# Patient Record
Sex: Male | Born: 1955 | Race: Asian | Hispanic: No | Marital: Single
Health system: Southern US, Community
[De-identification: ages and names within clinical notes are randomized; demographics above are authoritative.]

---

## 2020-06-28 ENCOUNTER — Emergency Department: Payer: Self-pay

## 2020-06-28 ENCOUNTER — Emergency Department
Admission: EM | Admit: 2020-06-28 | Discharge: 2020-07-23 | Disposition: E | Payer: Self-pay | Attending: Emergency Medicine | Admitting: Emergency Medicine

## 2020-06-28 ENCOUNTER — Encounter: Admission: EM | Disposition: E | Payer: Self-pay | Source: Home / Self Care

## 2020-06-28 ENCOUNTER — Other Ambulatory Visit: Payer: Self-pay

## 2020-06-28 DIAGNOSIS — I469 Cardiac arrest, cause unspecified: Secondary | ICD-10-CM | POA: Insufficient documentation

## 2020-06-28 DIAGNOSIS — Z978 Presence of other specified devices: Secondary | ICD-10-CM

## 2020-06-28 HISTORY — PX: CORONARY/GRAFT ACUTE MI REVASCULARIZATION: CATH118305

## 2020-06-28 HISTORY — PX: LEFT HEART CATH AND CORONARY ANGIOGRAPHY: CATH118249

## 2020-06-28 LAB — CBC WITH DIFFERENTIAL/PLATELET
Abs Immature Granulocytes: 2.13 10*3/uL — ABNORMAL HIGH (ref 0.00–0.07)
Basophils Absolute: 0.1 10*3/uL (ref 0.0–0.1)
Basophils Relative: 1 %
Eosinophils Absolute: 0.9 10*3/uL — ABNORMAL HIGH (ref 0.0–0.5)
Eosinophils Relative: 4 %
HCT: 38.9 % — ABNORMAL LOW (ref 39.0–52.0)
Hemoglobin: 12.3 g/dL — ABNORMAL LOW (ref 13.0–17.0)
Immature Granulocytes: 10 %
Lymphocytes Relative: 37 %
Lymphs Abs: 7.6 10*3/uL — ABNORMAL HIGH (ref 0.7–4.0)
MCH: 25.1 pg — ABNORMAL LOW (ref 26.0–34.0)
MCHC: 31.6 g/dL (ref 30.0–36.0)
MCV: 79.2 fL — ABNORMAL LOW (ref 80.0–100.0)
Monocytes Absolute: 0.6 10*3/uL (ref 0.1–1.0)
Monocytes Relative: 3 %
Neutro Abs: 9.4 10*3/uL — ABNORMAL HIGH (ref 1.7–7.7)
Neutrophils Relative %: 45 %
Platelets: 113 10*3/uL — ABNORMAL LOW (ref 150–400)
RBC: 4.91 MIL/uL (ref 4.22–5.81)
RDW: 15 % (ref 11.5–15.5)
WBC: 20.6 10*3/uL — ABNORMAL HIGH (ref 4.0–10.5)
nRBC: 0.2 % (ref 0.0–0.2)

## 2020-06-28 LAB — COMPREHENSIVE METABOLIC PANEL
ALT: 148 U/L — ABNORMAL HIGH (ref 0–44)
AST: 185 U/L — ABNORMAL HIGH (ref 15–41)
Albumin: 2.9 g/dL — ABNORMAL LOW (ref 3.5–5.0)
Alkaline Phosphatase: 75 U/L (ref 38–126)
Anion gap: 19 — ABNORMAL HIGH (ref 5–15)
BUN: 23 mg/dL (ref 8–23)
CO2: 20 mmol/L — ABNORMAL LOW (ref 22–32)
Calcium: 11.5 mg/dL — ABNORMAL HIGH (ref 8.9–10.3)
Chloride: 101 mmol/L (ref 98–111)
Creatinine, Ser: 1.63 mg/dL — ABNORMAL HIGH (ref 0.61–1.24)
GFR calc Af Amer: 52 mL/min — ABNORMAL LOW (ref >60)
GFR calc non Af Amer: 44 mL/min — ABNORMAL LOW (ref >60)
Glucose, Bld: 319 mg/dL — ABNORMAL HIGH (ref 70–99)
Potassium: 4.1 mmol/L (ref 3.5–5.1)
Sodium: 140 mmol/L (ref 135–145)
Total Bilirubin: 1.2 mg/dL (ref 0.3–1.2)
Total Protein: 5.1 g/dL — ABNORMAL LOW (ref 6.5–8.1)

## 2020-06-28 LAB — GLUCOSE, CAPILLARY: Glucose-Capillary: 255 mg/dL — ABNORMAL HIGH (ref 70–99)

## 2020-06-28 LAB — TROPONIN I (HIGH SENSITIVITY): Troponin I (High Sensitivity): 611 ng/L (ref <18)

## 2020-06-28 SURGERY — CORONARY/GRAFT ACUTE MI REVASCULARIZATION
Anesthesia: Moderate Sedation

## 2020-06-28 MED ORDER — ETOMIDATE 2 MG/ML IV SOLN
INTRAVENOUS | Status: AC | PRN
  Administered 2020-06-28: 30 mg via INTRAVENOUS

## 2020-06-28 MED ORDER — BIVALIRUDIN TRIFLUOROACETATE 250 MG IV SOLR
INTRAVENOUS | Status: AC
  Filled 2020-06-28: qty 250

## 2020-06-28 MED ORDER — MAGNESIUM SULFATE 50 % IJ SOLN
INTRAMUSCULAR | Status: AC | PRN
  Administered 2020-06-28: 2 g via INTRAVENOUS

## 2020-06-28 MED ORDER — AMIODARONE HCL 150 MG/3ML IV SOLN
INTRAVENOUS | Status: DC | PRN
  Administered 2020-06-28 (×3): 300 mg via INTRAVENOUS

## 2020-06-28 MED ORDER — LIDOCAINE HCL (PF) 1 % IJ SOLN
INTRAMUSCULAR | Status: DC | PRN
  Administered 2020-06-28: 20 mL

## 2020-06-28 MED ORDER — LIDOCAINE HCL (CARDIAC) PF 100 MG/5ML IV SOSY
PREFILLED_SYRINGE | INTRAVENOUS | Status: AC | PRN
  Administered 2020-06-28: 100 mg via INTRAVENOUS

## 2020-06-28 MED ORDER — MAGNESIUM SULFATE 50 % IJ SOLN
INTRAMUSCULAR | Status: DC | PRN
  Administered 2020-06-28: 2 g via INTRAVENOUS

## 2020-06-28 MED ORDER — AMIODARONE HCL 150 MG/3ML IV SOLN
INTRAVENOUS | Status: AC | PRN
  Administered 2020-06-28: 300 mg via INTRAVENOUS

## 2020-06-28 MED ORDER — FENTANYL CITRATE (PF) 100 MCG/2ML IJ SOLN
INTRAMUSCULAR | Status: AC
  Filled 2020-06-28: qty 2

## 2020-06-28 MED ORDER — AMIODARONE IV BOLUS ONLY 150 MG/100ML
INTRAVENOUS | Status: AC
  Filled 2020-06-28: qty 100

## 2020-06-28 MED ORDER — NOREPINEPHRINE 4 MG/250ML-% IV SOLN
INTRAVENOUS | Status: AC | PRN
  Administered 2020-06-28: 15 ug/min via INTRAVENOUS

## 2020-06-28 MED ORDER — CALCIUM CHLORIDE 10 % IV SOLN
INTRAVENOUS | Status: AC | PRN
  Administered 2020-06-28: 1 g via INTRAVENOUS

## 2020-06-28 MED ORDER — ROCURONIUM BROMIDE 50 MG/5ML IV SOLN
INTRAVENOUS | Status: AC | PRN
  Administered 2020-06-28: 30 mg via INTRAVENOUS

## 2020-06-28 MED ORDER — SODIUM BICARBONATE 8.4 % IV SOLN
INTRAVENOUS | Status: AC | PRN
  Administered 2020-06-28: 50 meq via INTRAVENOUS

## 2020-06-28 MED ORDER — EPINEPHRINE 1 MG/10ML IJ SOSY
PREFILLED_SYRINGE | INTRAMUSCULAR | Status: DC | PRN
  Administered 2020-06-28 (×4): 1 mg via INTRAVENOUS

## 2020-06-28 MED ORDER — SODIUM BICARBONATE 8.4 % IV SOLN
INTRAVENOUS | Status: AC
  Filled 2020-06-28: qty 100

## 2020-06-28 MED ORDER — METOPROLOL TARTRATE 5 MG/5ML IV SOLN
INTRAVENOUS | Status: AC
  Filled 2020-06-28: qty 5

## 2020-06-28 MED ORDER — SODIUM CHLORIDE 0.9 % IV BOLUS
1000.0000 mL | Freq: Once | INTRAVENOUS | Status: AC
  Administered 2020-06-28: 1000 mL via INTRAVENOUS

## 2020-06-28 MED ORDER — AMIODARONE HCL 150 MG/3ML IV SOLN
INTRAVENOUS | Status: AC | PRN
  Administered 2020-06-28 (×2): 150 mg via INTRAVENOUS

## 2020-06-28 MED ORDER — EPINEPHRINE 1 MG/10ML IJ SOSY
PREFILLED_SYRINGE | INTRAMUSCULAR | Status: AC | PRN
  Administered 2020-06-28 (×4): 0.1 mg via INTRAVENOUS

## 2020-06-28 MED ORDER — IOHEXOL 300 MG/ML  SOLN
INTRAMUSCULAR | Status: DC | PRN
  Administered 2020-06-28: 20 mL

## 2020-06-28 MED ORDER — ATROPINE SULFATE 1 MG/ML IJ SOLN
INTRAMUSCULAR | Status: AC | PRN
  Administered 2020-06-28: 1 mg via INTRAVENOUS

## 2020-06-28 MED ORDER — HEPARIN SODIUM (PORCINE) 1000 UNIT/ML IJ SOLN
INTRAMUSCULAR | Status: AC
  Filled 2020-06-28: qty 1

## 2020-06-28 MED ORDER — HEPARIN (PORCINE) IN NACL 1000-0.9 UT/500ML-% IV SOLN
INTRAVENOUS | Status: DC | PRN
  Administered 2020-06-28 (×2): 500 mL

## 2020-06-28 MED ORDER — ATROPINE SULFATE 1 MG/10ML IJ SOSY
PREFILLED_SYRINGE | INTRAMUSCULAR | Status: DC | PRN
  Administered 2020-06-28: 1 mg via INTRAVENOUS

## 2020-06-28 MED ORDER — MIDAZOLAM HCL 2 MG/2ML IJ SOLN
INTRAMUSCULAR | Status: AC
  Filled 2020-06-28: qty 2

## 2020-06-28 MED ORDER — NITROGLYCERIN 1 MG/10 ML FOR IR/CATH LAB
INTRA_ARTERIAL | Status: AC
  Filled 2020-06-28: qty 10

## 2020-06-28 MED ORDER — AMIODARONE HCL 150 MG/3ML IV SOLN
INTRAVENOUS | Status: AC
  Filled 2020-06-28: qty 6

## 2020-06-28 MED ORDER — SODIUM BICARBONATE 8.4 % IV SOLN
INTRAVENOUS | Status: DC | PRN
  Administered 2020-06-28 (×3): 100 meq via INTRAVENOUS

## 2020-06-28 MED ORDER — VERAPAMIL HCL 2.5 MG/ML IV SOLN
INTRAVENOUS | Status: AC
  Filled 2020-06-28: qty 2

## 2020-06-28 MED ORDER — AMIODARONE HCL IN DEXTROSE 360-4.14 MG/200ML-% IV SOLN
INTRAVENOUS | Status: AC
  Administered 2020-06-28: 6 mg/h
  Filled 2020-06-28: qty 200

## 2020-06-28 SURGICAL SUPPLY — 8 items
CATH INFINITI 5FR JL4 (CATHETERS) ×3 IMPLANT
CATH INFINITI JR4 5F (CATHETERS) IMPLANT
DEVICE INFLAT 30 PLUS (MISCELLANEOUS) IMPLANT
KIT MANI 3VAL PERCEP (MISCELLANEOUS) ×3 IMPLANT
NEEDLE PERC 18GX7CM (NEEDLE) ×3 IMPLANT
PACK CARDIAC CATH (CUSTOM PROCEDURE TRAY) ×3 IMPLANT
SHEATH AVANTI 6FR X 11CM (SHEATH) ×3 IMPLANT
WIRE GUIDERIGHT .035X150 (WIRE) ×3 IMPLANT

## 2020-06-29 MED FILL — Medication: Qty: 2 | Status: AC

## 2020-06-30 ENCOUNTER — Encounter: Payer: Self-pay | Admitting: Internal Medicine

## 2020-07-23 NOTE — Code Documentation (Signed)
Pulses felt

## 2020-07-23 NOTE — Code Documentation (Signed)
Per Dr Derrill Kay, page STEMI

## 2020-07-23 NOTE — Progress Notes (Signed)
Physician Discharge Summary      Patient ID: Darryl [name] MRN: [id number] DOB/AGE: 02-20-1956 64 y.o.  Admit date: 07/04/20 Discharge date: Jul 04, 2020  Primary Discharge Diagnosis witnessed arrest, probable code STEMI Secondary Discharge Diagnosis coronary artery disease sustained V. fib flex occluded LAD  Significant Diagnostic Studies: Incomplete cardiac cath showed normal large circumflex flush occlusion of the LAD probable IRA we were unable to assess the right coronary artery   Consults: ER physicians to run CODE Darryl [name] [name] Course: Patient presented after witnessed arrest to the emergency room unresponsive EKG showed some dynamic changes with possible ST changes in 1 concerning for possible STEMI.  Patient underwent extensive CODE BLUE with CPR pressors intubation amiodarone for recurrent V. Fib.  I received code STEMI page March 08, 2024.  Reportedly the patient had had a witnessed arrest and management and CPR was commenced around that time and continued here in emergency room at [name].extensive CPR caused a significant delay in bringing to the lab because the patient was being coded actively.  He was able to sustain a blood pressure of 82 systolic and somewhat stable rhythm which appeared to be sinus with ST segment changes.  Patient remained intubated and unresponsive.  Patient was brought to the cardiac Cath Lab where initially at some point were able to obtain a systolic blood pressure of 120 heart rate of 90.  We inserted a 6 French sheath into the right femoral artery and initially floated 5 French 4 curve left Judkins diagnostic catheter to access the left main.  Patient appeared to have extremely short left main but circumflex was large and with only minor irregularity.  LAD appeared to be flush occluded at the ostium with faint collaterals left to left we will unable to assess the right coronary artery or left ventricular function before the patient started having sustained V. fib  unresponsive to shocks.  The patient was given additional doses of amiodarone bolus bicarb epinephrine he had been maintained on amnio drip as well as Levophed.  Patient initially was able to revert to sinus rhythm after shocks but then after several shocks in a row he was not able to be converted over we called a CODE BLUE to get additional assistance and support from the emergency room.  Dr. Juliette Alcide came and ran the code.  Wait additional sustained CPR multiple shocks epinephrine bicarb magnesium but our efforts were unsuccessful and patient was pronounced at March 09, 2151.  Patient was never able to regain a stable rhythm her blood pressure and remain on responsive.  We never had a chance to attempt intervention before the patient began having sustained ventricular fibrillation.   Discharge Exam: Blood pressure (!) 82/55, pulse 80, temperature (!) 95.7 F (35.4 C), temperature source Bladder, resp. rate (!) 21, SpO2 96 %.   Patient intubated unresponsive NG tube in place HEENT normocephalic atraumatic Lung exam was clear on the vent intubated sedated Heart exam rate of 90 normal S1-S2 Abdominal exam was benign reduced bowel sounds Extremity exam was  within normal limits with reduced pulses Neurological exam unable to assess patient unresponsive  Labs:   Lab Results  Component Value Date   WBC 20.6 (H) 04-Jul-2020   HGB 12.3 (L) 2020/07/04   HCT 38.9 (L) 07-04-20   MCV 79.2 (L) July 04, 2020   PLT 113 (L) 07-04-20    Recent Labs  Lab 04-Jul-2020 2009  NA 140  K 4.1  CL 101  CO2 20*  BUN 23  CREATININE 1.63*  CALCIUM 11.5*  PROT 5.1*  BILITOT 1.2  ALKPHOS 75  ALT 148*  AST 185*  GLUCOSE 319*   Covid status unknown   Radiology: Clear NG tube ET tube in place EKG: Sustained V. fib  FOLLOW UP PLANS AND APPOINTMENTS  Allergies as of 07/21/20   Not on File     Medication List    You have not been prescribed any medications.    Disposition; to the morgue not ME case Because  of the likely cardiac arrest related to acute myocardial infarction flush occlusion of the LAD resulting in sustained ventricular fibrillation unresponsive to resuscitative efforts including defibrillation.  BRING ALL MEDICATIONS WITH YOU TO FOLLOW UP APPOINTMENTS  Time spent with patient to include physician time: 50 minutes Signed:  Alwyn Pea MD 07/21/2020, 9:50 PM

## 2020-07-23 NOTE — Code Documentation (Signed)
Pt shocked.  Compression resumed

## 2020-07-23 NOTE — Code Documentation (Signed)
V fib Pt shocked 

## 2020-07-23 NOTE — Code Documentation (Signed)
vfib Pt shocked

## 2020-07-23 NOTE — Consult Note (Signed)
CARDIOLOGY CONSULT NOTE               Patient ID: Darryl [name] MRN: [id number] DOB/AGE: 05-18-1956 64 y.o.  Admit date: 07/03/2020 Referring Physician Dr. Derrill Kay ER Primary Physician none Primary Cardiologist unknown Reason for Consultation code STEMI witnessed arrest  HPI: Patient reported a witnessed arrest C 64 year old Asian male no other history was available patient was emergency room with V. fib undergoing aggressive CPR patient had been given amiodarone Levophed intubated.  Once the patient was able to obtain a semireasonable blood pressure of 81 we agreed to take him to the lab for further evaluation and hopefully intention to treat for possible code STEMI.  We maintain amnio and Levophed.  Initial blood pressure on the cath table was 123 systolic heart rate of 84 I received and responded to the  code STEMI page at 2025.  There was a significant delay in the case because the patient was being actively coded with CPR and intubation and pressures in emergency room.  Once patient was sufficiently semistable then we agreed to attempt to bring him to the cardiac Cath Lab with intention to treat and hopefully intervene for this potential code STEMI.  Review of systems complete and found to be negative unless listed above  Patient intubated unresponsive so review of system was not possible    No past medical history on file.    No medications prior to admission.   Social History   Socioeconomic History  . Marital status: Single    Spouse name: Not on file  . Number of children: Not on file  . Years of education: Not on file  . Highest education level: Not on file  Occupational History  . Not on file  Tobacco Use  . Smoking status: Not on file  Substance and Sexual Activity  . Alcohol use: Not on file  . Drug use: Not on file  . Sexual activity: Not on file  Other Topics Concern  . Not on file  Social History Narrative  . Not on file   Social Determinants of  Health   Financial Resource Strain:   . Difficulty of Paying Living Expenses:   Food Insecurity:   . Worried About Programme researcher, broadcasting/film/video in the Last Year:   . Barista in the Last Year:   Transportation Needs:   . Freight forwarder (Medical):   Marland Kitchen Lack of Transportation (Non-Medical):   Physical Activity:   . Days of Exercise per Week:   . Minutes of Exercise per Session:   Stress:   . Feeling of Stress :   Social Connections:   . Frequency of Communication with Friends and Family:   . Frequency of Social Gatherings with Friends and Family:   . Attends Religious Services:   . Active Member of Clubs or Organizations:   . Attends Banker Meetings:   Marland Kitchen Marital Status:   Intimate Partner Violence:   . Fear of Current or Ex-Partner:   . Emotionally Abused:   Marland Kitchen Physically Abused:   . Sexually Abused:     No family history on file.    Review of systems complete and found to be negative unless listed above      PHYSICAL EXAM  General: Well developed, well nourished,  intubated unresponsive HEENT:  Normocephalic and atramatic pupils unreactive Neck:  No JVD.  Lungs: Clear bilaterally to auscultation and percussion. Heart: Tachycardic. Normal S1 and S2 without gallops or murmurs.  Abdomen: Bowel sounds are positive, abdomen soft and non-tender  Msk:  Back normal, .  Unable to assess the patient unresponsive Extremities: No clubbing, cyanosis or edema.   Neuro: Intubated unresponsive Psych: Unable to assess  Labs:   Lab Results  Component Value Date   WBC 20.6 (H) 2020/07/16   HGB 12.3 (L) 2020/07/16   HCT 38.9 (L) Jul 16, 2020   MCV 79.2 (L) 07/16/2020   PLT 113 (L) Jul 16, 2020    Recent Labs  Lab 07-16-2020 2009  NA 140  K 4.1  CL 101  CO2 20*  BUN 23  CREATININE 1.63*  CALCIUM 11.5*  PROT 5.1*  BILITOT 1.2  ALKPHOS 75  ALT 148*  AST 185*  GLUCOSE 319*   No results found for: CKTOTAL, CKMB, CKMBINDEX, TROPONINI No results found for:  CHOL No results found for: HDL No results found for: LDLCALC No results found for: TRIG No results found for: CHOLHDL No results found for: LDLDIRECT    Radiology: DG Chest Port 1 View  Result Date: 07-16-20 CLINICAL DATA:  Repositioning endotracheal tube EXAM: PORTABLE CHEST 1 VIEW COMPARISON:  Jul 16, 2020 FINDINGS: Endotracheal tube has been slightly retracted. The tip is just above the carina. This could be retracted approximately 2 cm for optimal positioning. Airspace disease seen throughout the left lung and right upper lobe, presumably atelectasis related to prior right mainstem intubation. Heart is normal size. IMPRESSION: Endotracheal tube just above the carina. This could be retracted further, approximately 2 cm for optimal positioning. Airspace opacities bilaterally as above, presumably atelectasis. Electronically Signed   By: Charlett Nose M.D.   On: 2020/07/16 21:03   DG Chest Port 1 View  Result Date: 07/16/20 CLINICAL DATA:  Endotracheal tube repositioning EXAM: PORTABLE CHEST 1 VIEW COMPARISON:  07-16-20 FINDINGS: Endotracheal tube has been retracted and is now just into the right mainstem bronchus. Recommend retracting approximately 3 cm for better positioning. Areas of atelectasis throughout the left lung and in the right upper lobe. IMPRESSION: Continued right mainstem intubation.  Recommend retracting 3 cm. Electronically Signed   By: Charlett Nose M.D.   On: July 16, 2020 21:02   DG Chest Portable 1 View  Result Date: 2020/07/16 CLINICAL DATA:  Post intubation EXAM: PORTABLE CHEST 1 VIEW COMPARISON:  None. FINDINGS: Endotracheal tube is in the right mainstem bronchus. Recommend retracting approximately 6 cm. Areas of atelectasis in the right upper lobe and diffusely throughout the left lung. Heart is normal size. IMPRESSION: Right mainstem intubation.  Recommend retracting 6 cm. Electronically Signed   By: Charlett Nose M.D.   On: Jul 16, 2020 21:01    EKG: Multiple EKGs some sinus  rhythm with nonspecific findings no clear distant ST elevation suggestive of criteria for STEMI  ASSESSMENT AND PLAN:  Code STEMI Witnessed arrest Respiratory failure intubated Abnormal EKG Sustained V. tach V. Fib Probable coronary disease Chronic renal insufficiency Elevated liver enzymes Hyperglycemia  . Plan Patient is extremely unstable in the emergency room on pressors amnio after a witnessed arrest intubated. Concern is that this may be STEMI that resulted in arrest once the patient was semistable will take him to the lab for further evaluation with a time to intervene Initial blood pressure on evaluation was 81 but we finally got a blood pressure of 120 systolic heart rate of 90 so we elected to try to take him to the lab emergently Continue amiodarone for sustained V. Fib We will try to add magnesium IV We will evaluate and  correct any abnormal electrolytes Continue  ventilatory support  Signed: Alwyn Pea MD 07/09/20, 9:38 PM

## 2020-07-23 NOTE — Code Documentation (Signed)
Chest compressions resumed 

## 2020-07-23 NOTE — Final Consult Note (Signed)
Interventional cardiology follow-up note After discussion with the family who is her nephew Reportedly the patient was helping a friend clean an apartment or house and he was sweeping the floor.  At some point the friend realized he had not seen or heard from him in a while and he saw the broom on the floor so he went looking for him and around the corner he is time on the ground unresponsive and that is when they called EMS.  Is not clear how long he was on the ground for for before resuscitative efforts were begun. The nephew states there is no known cardiac history.  So the arrest was not absolutely witnessed but he was found down after short time unable to quantify how long. Resuscitative efforts were begun in the scene then in EMS and then in the emergency room.  We were finally able to bring him to the cardiac Cath Lab around 03-20-2109 in an effort to try to evaluate his coronary anatomy and hopefully potentially intervene for what we thought was a cardiac arrest myocardial infarction code STEMI. Unfortunately her efforts were unsuccessful.  And the patient was pronounced by Dr. Juliette Alcide who ran the CODE BLUE in the Cath Lab after the patient had sustained V. fib.  Time of death was 2151-03-21

## 2020-07-23 NOTE — Code Documentation (Signed)
Pt in vfib shocked

## 2020-07-23 NOTE — Code Documentation (Signed)
Pt shocked 

## 2020-07-23 NOTE — Code Documentation (Signed)
Darryl [name] paused for rhythm check.  V fib Pt shocked

## 2020-07-23 NOTE — Code Documentation (Signed)
Levo changed to 54mcg/min

## 2020-07-23 NOTE — ED Notes (Signed)
Lab contacted to verify adding on troponin, informed code stemi

## 2020-07-23 NOTE — Code Documentation (Signed)
V fib Pt shocked

## 2020-07-23 NOTE — Code Documentation (Signed)
Chest compression resumed

## 2020-07-23 NOTE — Code Documentation (Signed)
Compressions paused, pulses felt

## 2020-07-23 NOTE — Code Documentation (Signed)
Compressions paused.  Pulses felt

## 2020-07-23 NOTE — Code Documentation (Signed)
V fib Shocked

## 2020-07-23 NOTE — ED Notes (Signed)
Pt to cath lab.

## 2020-07-23 NOTE — Code Documentation (Signed)
Chest compressions paused,  vfib

## 2020-07-23 NOTE — Code Documentation (Signed)
Per Dr Derrill Kay  30 Rocc 30 etomidate

## 2020-07-23 NOTE — ED Notes (Signed)
Dr. Callwood at bedside. °

## 2020-07-23 NOTE — Code Documentation (Signed)
Compressions paused.  Pt shocked

## 2020-07-23 NOTE — ED Provider Notes (Addendum)
[facility] Emergency Department Provider Note  _________________________________________   I have reviewed the triage vital signs and the nursing notes.   HISTORY  Chief Complaint cpr   History limited by and level 5 caveat due to: unresponsivness   HPI Darryl [name] is a 64 y.o. male who presents to the emergency department today via EMS as emergency department after cardiopulmonary arrest. Per report that patient was at work at [employer] when he went outside and collapsed. Bystander CPR was initiated. Upon arrival EMS noted both vfib/vtach and delivered multiple rounds of epinephrine and electric cardioversion without any return of circulation.    Prior to Admission medications   Not on File    Allergies Patient has no allergy information on record.  No family history on file.  Social History Social History   Tobacco Use  . Smoking status: Not on file  Substance Use Topics  . Alcohol use: Not on file  . Drug use: Not on file    Review of Systems Unable to obtain secondary to cardiopulmonary arrest ____________________________________________   PHYSICAL EXAM:  VITAL SIGNS: ED Triage Vitals  Enc Vitals Group     BP July 15, 2020 2010 (!) 124/97     Pulse Rate 07-15-20 2010 70     Resp July 15, 2020 2010 (!) 21     Temp 2020/07/15 2047 (!) 95.7 F (35.4 C)     Temp Source July 15, 2020 2047 Bladder     SpO2 15-Jul-2020 2010 96 %   Constitutional: Unresponsive.  Eyes: Conjunctivae are normal. Pupils 3 mm bilaterally and responsive. ENT      Head: Normocephalic and atraumatic.      Nose: No congestion/rhinnorhea.      Mouth/Throat: Mucous membranes are moist.      Neck: No stridor. Hematological/Lymphatic/Immunilogical: No cervical lymphadenopathy. Cardiovascular: Samuel Bouche device administering compressions. Respiratory: King airway in place. Actively being bagged. Gastrointestinal: Soft and non tender. Genitourinary: Deferred Musculoskeletal:  No  lower extremity edema. Neurologic:  Unresponsive.  Skin:  Skin is warm, dry and intact. No rash noted.  ____________________________________________    LABS (pertinent positives/negatives)  Trop hs 611 CBC wbc 20.6, hgb 12.3, plt 113 CMP na 140, k 4.1, glu 319, cr 1.63  ____________________________________________   EKG  I, [name], attending physician, personally viewed and interpreted this EKG  EKG Time: 2005 Rate: 135 Rhythm: sinus rhythm with premature beats Axis: normal Intervals: qtc 450 QRS: intraventricular conduction delay ST changes: st elevation I, aVL, v3 Impression: STEMI   ____________________________________________    RADIOLOGY  CXR Initial ET tube position right main stem  CXR Tip at the carina  ____________________________________________   PROCEDURES  Procedures   INTUBATION Performed by: [name]  Required items: required blood products, implants, devices, and special equipment available Patient identity confirmed: provided demographic data and hospital-assigned identification number Time out: Immediately prior to procedure a "time out" was called to verify the correct patient, procedure, equipment, support staff and site/side marked as required.  Indications: cardiopulmonary arrest  Intubation method: Glidescope Laryngoscopy   Preoxygenation: Brooke Dare airway  Sedatives: Etomidate Paralytic: Rocuronium  Tube Size: 8 cuffed  Post-procedure assessment: chest rise and ETCO2 monitor Breath sounds: equal and absent over the epigastrium Tube secured with: ETT holder Chest x-ray interpreted by radiologist and me.  Chest x-ray findings: endotracheal tube in right main stem, pulled back.  Patient tolerated the procedure well with no immediate complications.   Cardiopulmonary Resuscitation (CPR) Procedure Note Directed/Performed by: [name] I personally directed ancillary staff  and/or performed CPR in an effort  to regain return of spontaneous circulation and to maintain cardiac, neuro and systemic perfusion.   CRITICAL CARE Performed by: [name]   Total critical care time: 60 minutes  Critical care time was exclusive of separately billable procedures and treating other patients.  Critical care was necessary to treat or prevent imminent or life-threatening deterioration.  Critical care was time spent personally by me on the following activities: development of treatment plan with patient and/or surrogate as well as nursing, discussions with consultants, evaluation of patient's response to treatment, examination of patient, obtaining history from patient or surrogate, ordering and performing treatments and interventions, ordering and review of laboratory studies, ordering and review of radiographic studies, pulse oximetry and re-evaluation of patient's condition.   ____________________________________________   INITIAL IMPRESSION / ASSESSMENT AND PLAN / ED COURSE  Pertinent labs & imaging results that were available during my care of the patient were reviewed by me and considered in my medical decision making (see chart for details).   Patient presented to the emergency department today via EMS as emergency traffic after cardiopulmonary arrest.  Upon arrival patient was in variably V. tach and V. fib.  Please see nursing documentation for exact sequence of medications and cardioversions performed.  After multiple rounds of CPR we were able to get return of spontaneous circulation.  He then would intermittently go back into a pulseless V tach.  At one point it did appear to be consistent with torsades of magnesium was given.  After ROSC however the patient continued to be hypotensive.  Norpinephrine was started.  EKG was concerning for possible ST elevation MI.  Code STEMI was called.  Dr. Juliann Pares with cardiology came in took patient emergently to catheterization  lab.  ____________________________________________   FINAL CLINICAL IMPRESSION(S) / ED DIAGNOSES  Final diagnoses:  Cardiopulmonary arrest [facility])     Note: This dictation was prepared with Dragon dictation. Any transcriptional errors that result from this process are unintentional     [name], MD 07-09-2020 [id number]    [name], MD 2020/07/09 [phone]

## 2020-07-23 NOTE — Code Documentation (Signed)
Pt undressed, belongings placed in belonging bag

## 2020-07-23 NOTE — ED Triage Notes (Signed)
Pt to ED via [facility] from work [employer], witnessed arrest/ fell. CPR started by fire.  defib x6 by EMS, reports vfib and PEA, epi given x6, last 20 mins ago. Amio, bicarb, and calcium given PTA x1.  IO to right tib fib

## 2020-07-23 NOTE — Progress Notes (Signed)
CH responded to CODE STEMI in ED at 8:12pm; upon [facility] arrival at rm., pt. being attended to by medical team who were administering compressions via a machine.  CH learned from RN that family was outside ED waiting; CH waited to make contact w/family for a little while as team attempted to stabilize pt. for transport to cath lab.  CH ultimately updated family of situation after talking w/pt.'s RN and [facility]; pt.'s nephew and several additional family members/friends gathered outside ED entrance; nephew helped translate for rest of support persons present.  Pt. eventually brought to cath lab; just as [facility] was about to bring two family members to cath waiting area CODE BLUE called in cath lab; [facility] followed up and learned pt.'s heart had stopped duirng procedure --> CODE underway in cath lab.  CH brought nephew back to cath waiting area.  Pt. pronounced dead after sustained efforts were made to revive him; [facility] assisted in escorting pt.'s older sister and gathered family members to cath lab after pt. had been prepared for family visit.  Sister very distraught and tearful and had to be brought to cath lab in wheelchair.  CH provided extended supportive presence and helped reassure pt.'s coworker that he had done everything he could have done.  Family grateful for [facility] support.

## 2020-07-23 NOTE — Code Documentation (Signed)
V fib, pt shocked

## 2020-07-23 NOTE — Code Documentation (Addendum)
Pt intubated 26 at lip 8ETT, RT at bedside Positive color change

## 2020-07-23 NOTE — Code Documentation (Signed)
Pt placed on lucas 

## 2020-07-23 NOTE — Code Documentation (Signed)
V fib Pt shocked x2

## 2020-07-23 NOTE — Code Documentation (Signed)
V fib,  Pt shocked

## 2020-07-23 NOTE — ED Provider Notes (Signed)
[facility]  Department of Emergency Medicine   Code Blue CONSULT NOTE  Chief Complaint: Cardiac arrest/unresponsive   Level V Caveat: Unresponsive  History of present illness: I was contacted by the hospital for a CODE BLUE cardiac arrest patient had been found down behind a restaurant.  Unknown downtime.  EMS was called thought they had obtained ROSC.  There was a 20-minute transport time during which time I understand CPR was continued.  On arrival here CPR was continued and circulation was obtained after prolonged CPR.  Dr. Vennie Homans cardiology took him to the Cath Lab.  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  Immediately upon finding this he coded again.  I was called for this code.  Patient was intubated and receiving CPR when I got there.  Patient was in V. fib.  He received a total of 4 doses of epinephrine and 3 doses of bicarb.  He also received 350 mg boluses of amiodarone.  He then had PEA at a rate of 20.  He received 1 amp of atropine 1 mg.  This did not change anything.  Patient was defibrillated at least 5 times.  Samuel Bouche device was brought into the Cath Lab and applied to the patient.  There was never any ROSC.  After 20 minutes we stoped CPR for rhythm check.  The patient was bradycardia in the 20s with an within several seconds became asystolic.  In view of the prolonged and repeated episodes of CPR we called the code at 952.  ROS: Unable to obtain, Level V caveat  Scheduled Meds: Continuous Infusions: Please see the previous note from Dr. Derrill Kay. Not on File  Last set of Vital Signs (not current) Vitals:   07-23-2020 2054 07/23/2020 2055  BP: (!) 73/62 (!) 82/55  Pulse: 77 80  Resp:    Temp:    SpO2:        Physical Exam  Gen: unresponsive Cardiovascular: pulseless  Resp: apneic. Breath sounds equal bilaterally with bagging  Abd: nondistended  Neuro: GCS 3, unresponsive to pain  HEENT: Patient intubated blood coming out of  the tube in the patient's mouth Neck: No crepitus  Musculoskeletal: No deformity  Skin: warm  Procedures   CRITICAL CARE Performed by: Arnaldo Natal Total critical care time: 20 Critical care time was exclusive of separately billable procedures and treating other patients. Critical care was necessary to treat or prevent imminent or life-threatening deterioration. Critical care was time spent personally by me on the following activities: development of treatment plan with patient and/or surrogate as well as nursing, discussions with Dr. Juliann Pares and critical care nurse practitioner evaluation of patient's response to treatment, examination of patient, obtaining history from Cath Lab staff, ordering and performing treatments and interventions,  review of laboratory studies,  review of radiographic studies, pulse oximetry and re-evaluation of patient's condition.  Cardiopulmonary Resuscitation (CPR) Procedure Note  Directed/Performed by: Arnaldo Natal I personally directed ancillary staff and/or performed CPR in an effort to regain return of spontaneous circulation and to maintain cardiac, neuro and systemic perfusion.    Medical Decision making  Patient had known LAD occlusion and was unresponsive to CPR.  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

## 2020-07-23 DEATH — deceased

## 2021-04-09 IMAGING — DX DG CHEST 1V PORT
1 series · 1 of 1 positions shown · non-contrast
Comparison: 06/28/2020

CLINICAL DATA: Endotracheal tube repositioning

EXAM:
PORTABLE CHEST 1 VIEW

[chest ap]
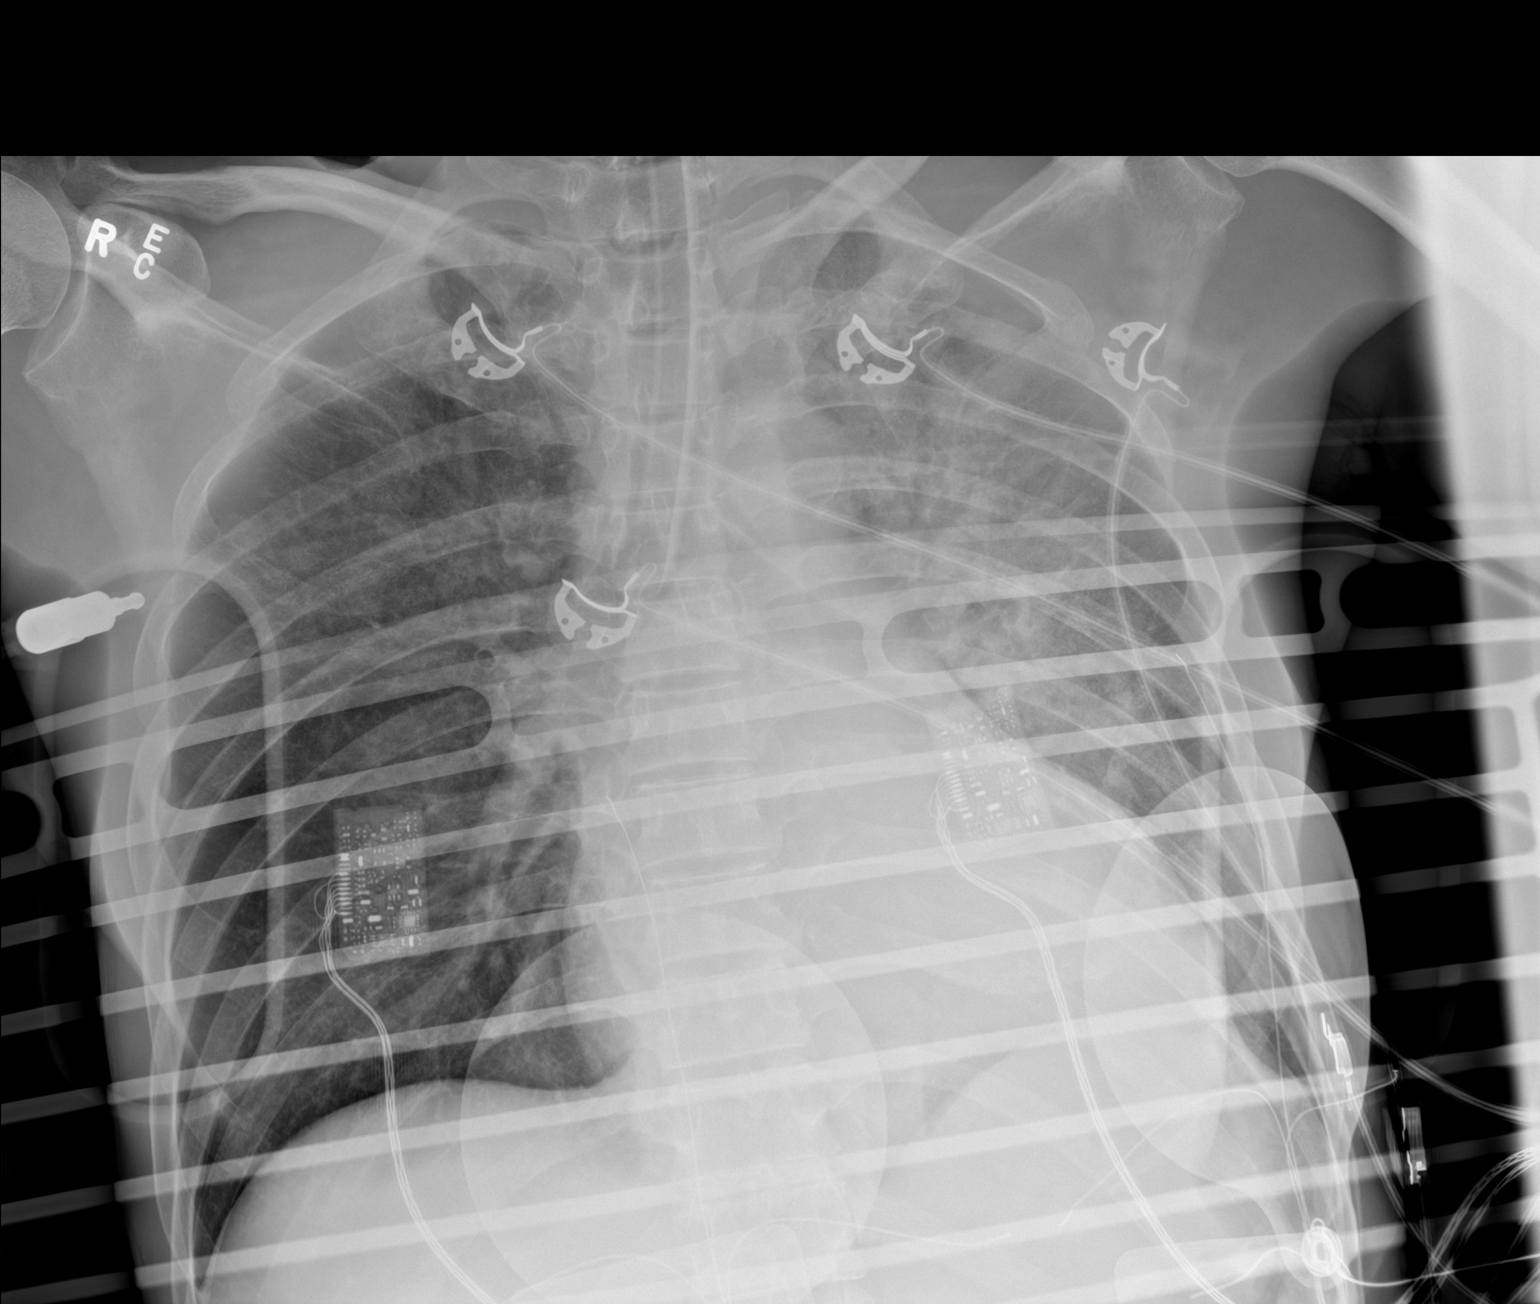

[1 of 1 positions shown; findings below may reference images not displayed]

FINDINGS: Endotracheal tube has been retracted and is now just into the right
mainstem bronchus. Recommend retracting approximately 3 cm for
better positioning. Areas of atelectasis throughout the left lung
and in the right upper lobe.
IMPRESSION: Continued right mainstem intubation.  Recommend retracting 3 cm.
